# Patient Record
Sex: Male | Born: 1960 | Race: White | Hispanic: No | Marital: Married | Smoking: Current every day smoker
Health system: Southern US, Community
[De-identification: ages and names within clinical notes are randomized; demographics above are authoritative.]

## PROBLEM LIST (undated history)

## (undated) DIAGNOSIS — F329 Major depressive disorder, single episode, unspecified: Secondary | ICD-10-CM

## (undated) DIAGNOSIS — I10 Essential (primary) hypertension: Secondary | ICD-10-CM

## (undated) DIAGNOSIS — E7800 Pure hypercholesterolemia, unspecified: Secondary | ICD-10-CM

## (undated) DIAGNOSIS — F32A Depression, unspecified: Secondary | ICD-10-CM

## (undated) DIAGNOSIS — R0989 Other specified symptoms and signs involving the circulatory and respiratory systems: Secondary | ICD-10-CM

## (undated) DIAGNOSIS — R0602 Shortness of breath: Secondary | ICD-10-CM

## (undated) DIAGNOSIS — R51 Headache: Secondary | ICD-10-CM

## (undated) DIAGNOSIS — R011 Cardiac murmur, unspecified: Secondary | ICD-10-CM

## (undated) DIAGNOSIS — R519 Headache, unspecified: Secondary | ICD-10-CM

## (undated) DIAGNOSIS — R42 Dizziness and giddiness: Secondary | ICD-10-CM

## (undated) DIAGNOSIS — M549 Dorsalgia, unspecified: Secondary | ICD-10-CM

## (undated) DIAGNOSIS — G8929 Other chronic pain: Secondary | ICD-10-CM

## (undated) HISTORY — DX: Dizziness and giddiness: R42

## (undated) HISTORY — DX: Other chronic pain: G89.29

## (undated) HISTORY — DX: Shortness of breath: R06.02

## (undated) HISTORY — DX: Major depressive disorder, single episode, unspecified: F32.9

## (undated) HISTORY — DX: Headache: R51

## (undated) HISTORY — DX: Depression, unspecified: F32.A

## (undated) HISTORY — DX: Cardiac murmur, unspecified: R01.1

## (undated) HISTORY — DX: Other specified symptoms and signs involving the circulatory and respiratory systems: R09.89

## (undated) HISTORY — DX: Headache, unspecified: R51.9

## (undated) HISTORY — DX: Essential (primary) hypertension: I10

## (undated) HISTORY — DX: Dorsalgia, unspecified: M54.9

## (undated) HISTORY — DX: Pure hypercholesterolemia, unspecified: E78.00

---

## 2010-03-08 ENCOUNTER — Emergency Department (HOSPITAL_COMMUNITY): Admission: EM | Admit: 2010-03-08 | Discharge: 2010-03-08 | Payer: Self-pay | Admitting: Emergency Medicine

## 2010-10-10 ENCOUNTER — Emergency Department (HOSPITAL_COMMUNITY): Admission: EM | Admit: 2010-10-10 | Discharge: 2010-10-10 | Payer: Self-pay | Admitting: Emergency Medicine

## 2011-01-12 IMAGING — CT CT ABD-PELV W/O CM
2 of 4 series · 17 of 46 positions shown, 19 images · non-contrast
Comparison: None.

CLINICAL DATA: Right side pain.

CT ABDOMEN AND PELVIS WITHOUT CONTRAST
TECHNIQUE: Multidetector CT imaging of the abdomen and pelvis was
performed following the standard protocol without intravenous
contrast.

[Series 2: a/p w/o 5.0 b31f st · axial · non-contrast · 0.92mm/px · z∈[-673,-213]mm · 14 of 102 slices shown, 16 images]
[im 5/102  soft-tissue]
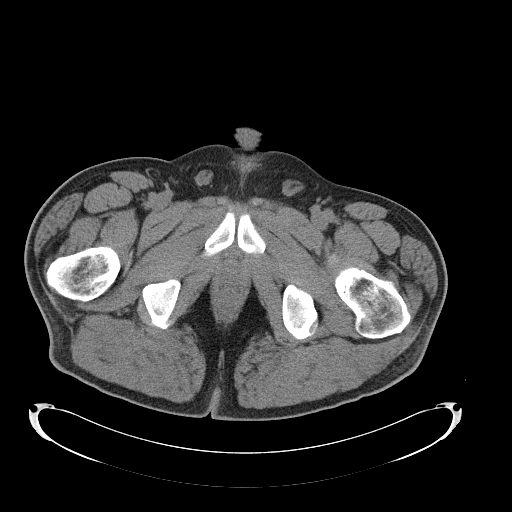
[im 5/102  bone]
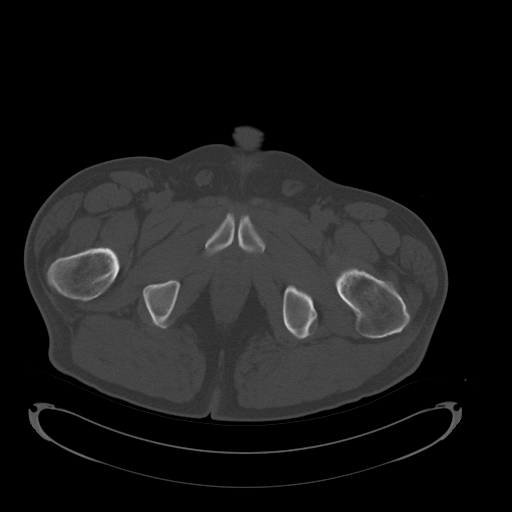
[im 14/102  soft-tissue]
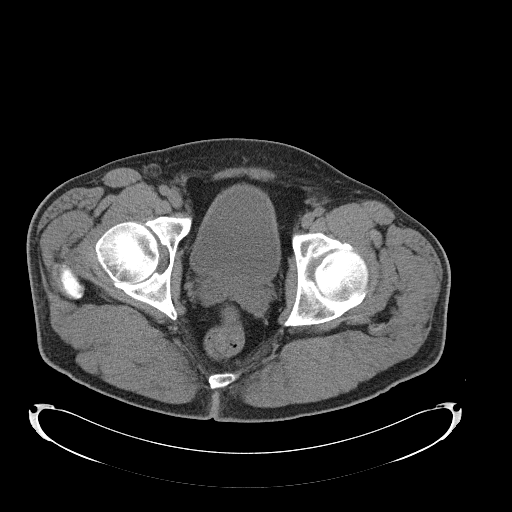
[im 18/102  soft-tissue]
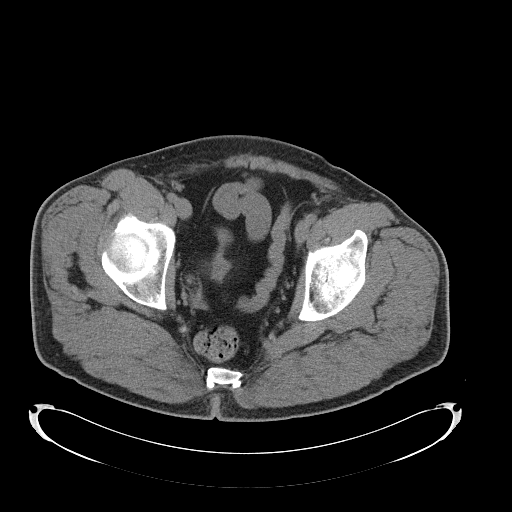
[im 27/102  soft-tissue]
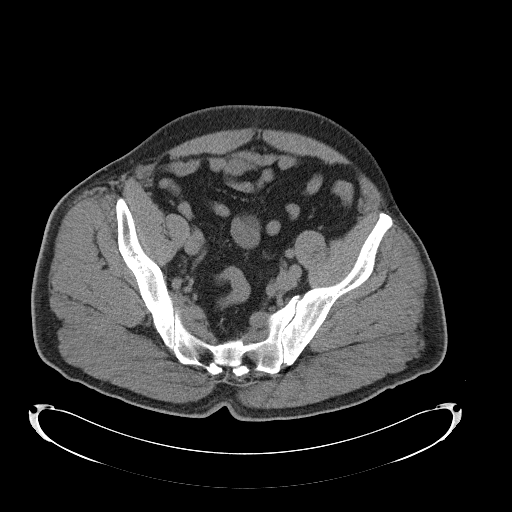
[im 36/102  soft-tissue]
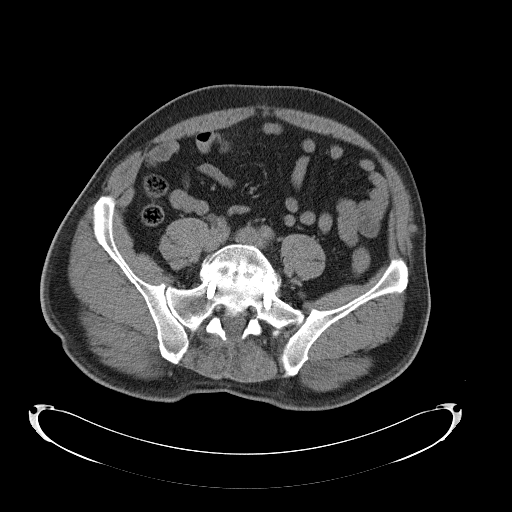
[im 40/102  soft-tissue]
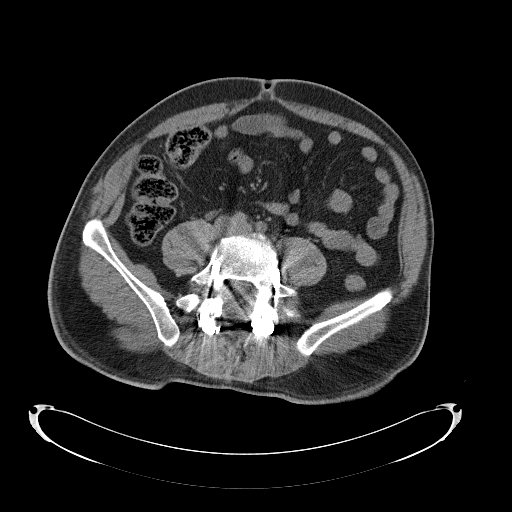
[im 49/102  soft-tissue]
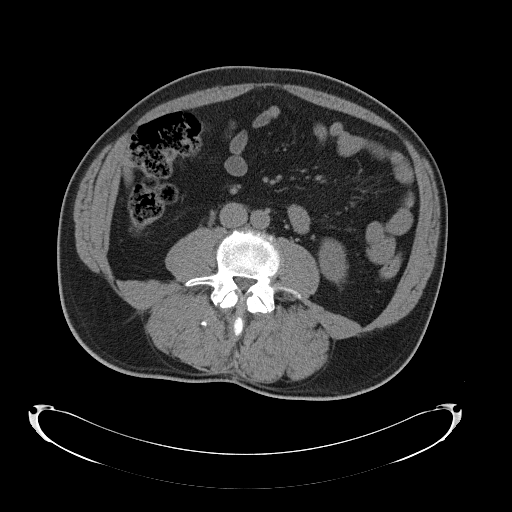
[im 53/102  soft-tissue]
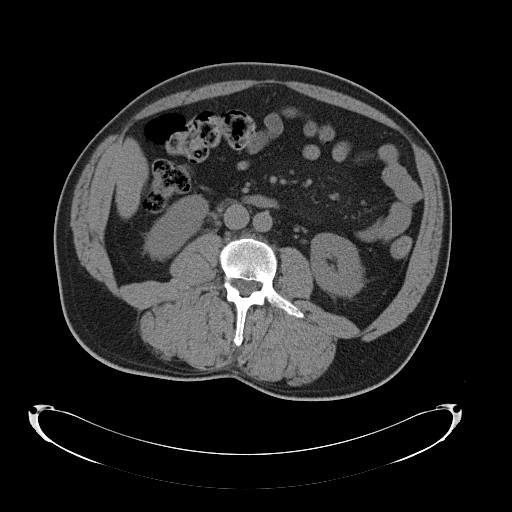
[im 62/102  soft-tissue]
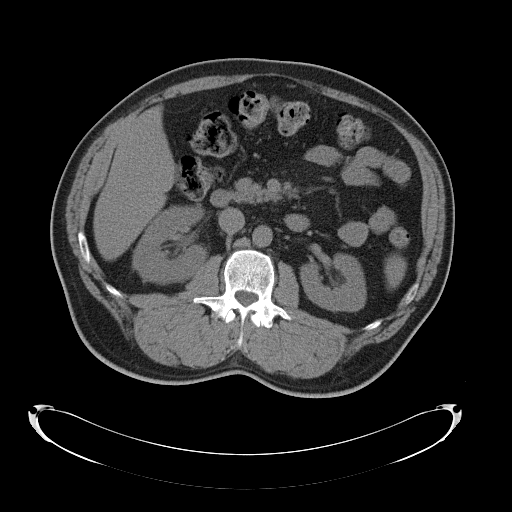
[im 62/102  bone]
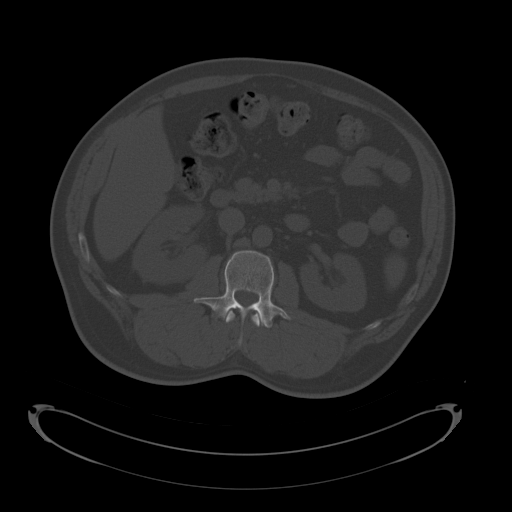
[im 66/102  soft-tissue]
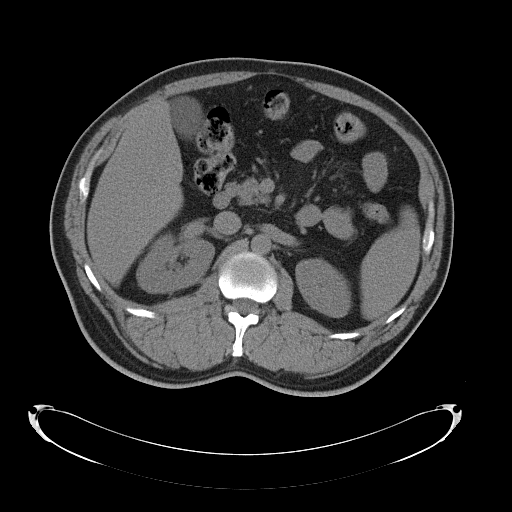
[im 75/102  soft-tissue]
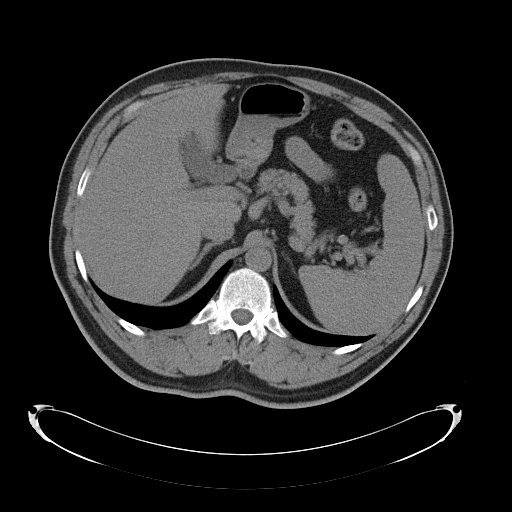
[im 84/102  soft-tissue]
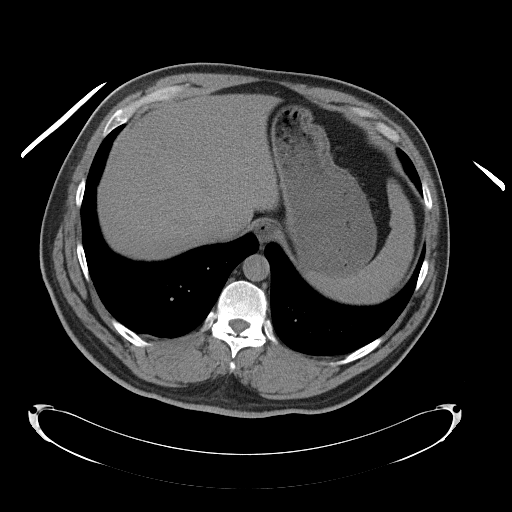
[im 88/102  soft-tissue]
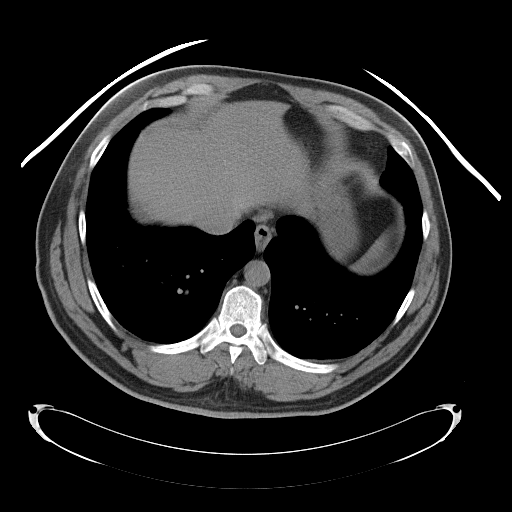
[im 97/102  soft-tissue]
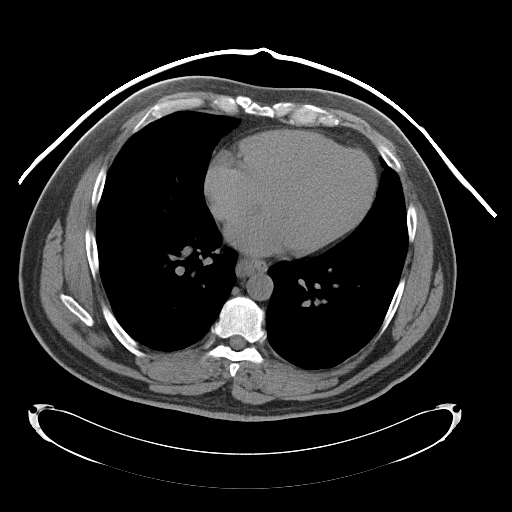

[Series 5: a/p w/o 2.0 spo st · coronal · non-contrast · 0.99mm/px · 3 of 159 slices shown]
[im 53/159  soft-tissue]
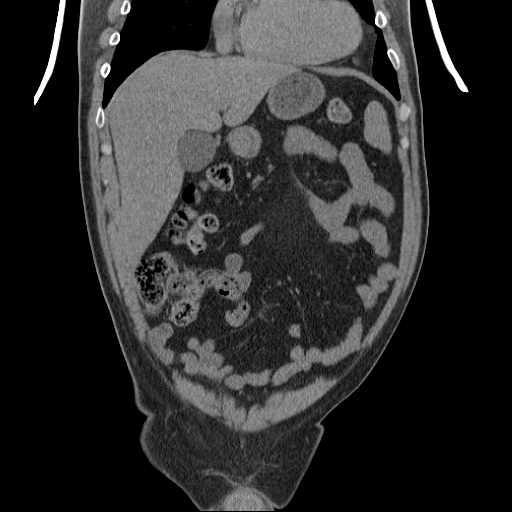
[im 71/159  soft-tissue]
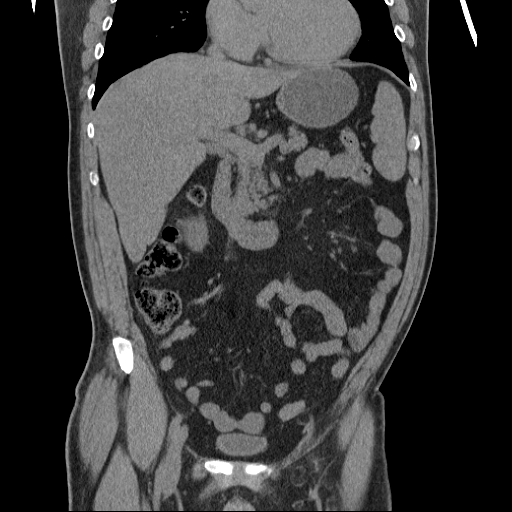
[im 88/159  soft-tissue]
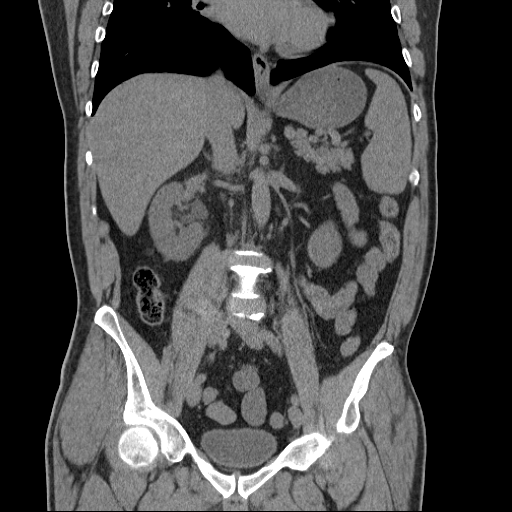

[17 of 46 positions shown; findings below may reference images not displayed]

FINDINGS: Negative liver, spleen, pancreas, adrenal glands, and
left kidney.  Subtle soft tissue stranding adjacent to the right
renal hilus and lower pole of the left kidney.  This may
potentially be due to recent right-sided urinary tract obstruction
however I detect no present evidence of an obstructing calculus.
No hydroureteronephrosis.

Small gallstones.  No findings to strongly suggest acute
cholecystitis.  No biliary duct dilatation.  No findings to suggest
acute appendicitis.  No free fluid.  The pelvis is unremarkable.
IMPRESSION: Cholelithiasis.  Subtle soft tissue stranding adjacent to the right
kidney may be a result of recent obstruction however no present
evidence of hydroureteronephrosis or calculus.

Negative for appendicitis.

## 2011-01-25 LAB — CBC
Hemoglobin: 16.4 g/dL (ref 13.0–17.0)
MCHC: 36.8 g/dL — ABNORMAL HIGH (ref 30.0–36.0)
Platelets: 177 10*3/uL (ref 150–400)
RDW: 13 % (ref 11.5–15.5)

## 2011-01-25 LAB — DIFFERENTIAL
Eosinophils Absolute: 0.2 10*3/uL (ref 0.0–0.7)
Eosinophils Relative: 3 % (ref 0–5)
Lymphocytes Relative: 26 % (ref 12–46)
Neutro Abs: 4.2 10*3/uL (ref 1.7–7.7)

## 2011-01-25 LAB — BASIC METABOLIC PANEL
CO2: 29 mEq/L (ref 19–32)
Calcium: 9.3 mg/dL (ref 8.4–10.5)
Chloride: 107 mEq/L (ref 96–112)
GFR calc Af Amer: >60 mL/min (ref >60)
Glucose, Bld: 203 mg/dL — ABNORMAL HIGH (ref 70–99)

## 2011-01-31 LAB — BASIC METABOLIC PANEL
CO2: 29 mEq/L (ref 19–32)
Calcium: 8.8 mg/dL (ref 8.4–10.5)
Chloride: 103 mEq/L (ref 96–112)
GFR calc non Af Amer: >60 mL/min (ref >60)
Potassium: 3.9 mEq/L (ref 3.5–5.1)

## 2011-01-31 LAB — URINALYSIS, ROUTINE W REFLEX MICROSCOPIC
Bilirubin Urine: NEGATIVE
Protein, ur: NEGATIVE mg/dL
Specific Gravity, Urine: 1.026 (ref 1.005–1.030)

## 2011-01-31 LAB — CBC
Hemoglobin: 15.7 g/dL (ref 13.0–17.0)
MCV: 93 fL (ref 78.0–100.0)
Platelets: 168 10*3/uL (ref 150–400)
RBC: 4.89 MIL/uL (ref 4.22–5.81)
WBC: 10.3 10*3/uL (ref 4.0–10.5)

## 2011-01-31 LAB — DIFFERENTIAL
Basophils Absolute: 0 10*3/uL (ref 0.0–0.1)
Basophils Relative: 0 % (ref 0–1)
Lymphocytes Relative: 9 % — ABNORMAL LOW (ref 12–46)
Lymphs Abs: 1 10*3/uL (ref 0.7–4.0)

## 2011-01-31 LAB — URINE MICROSCOPIC-ADD ON

## 2012-11-19 ENCOUNTER — Encounter: Payer: Self-pay | Admitting: Cardiovascular Disease

## 2012-11-19 ENCOUNTER — Encounter: Payer: Self-pay | Admitting: *Deleted

## 2012-11-19 DIAGNOSIS — F32A Depression, unspecified: Secondary | ICD-10-CM | POA: Insufficient documentation

## 2012-11-19 DIAGNOSIS — M549 Dorsalgia, unspecified: Secondary | ICD-10-CM | POA: Insufficient documentation

## 2012-11-19 DIAGNOSIS — E7800 Pure hypercholesterolemia, unspecified: Secondary | ICD-10-CM | POA: Insufficient documentation

## 2012-11-19 DIAGNOSIS — F329 Major depressive disorder, single episode, unspecified: Secondary | ICD-10-CM | POA: Insufficient documentation

## 2012-11-20 ENCOUNTER — Encounter: Payer: Self-pay | Admitting: Cardiovascular Disease

## 2012-11-20 ENCOUNTER — Ambulatory Visit (INDEPENDENT_AMBULATORY_CARE_PROVIDER_SITE_OTHER): Payer: 59 | Admitting: Cardiovascular Disease

## 2012-11-20 VITALS — BP 148/89 | HR 89 | Ht 74.0 in | Wt 261.0 lb

## 2012-11-20 DIAGNOSIS — R079 Chest pain, unspecified: Secondary | ICD-10-CM

## 2012-11-20 DIAGNOSIS — E7800 Pure hypercholesterolemia, unspecified: Secondary | ICD-10-CM

## 2012-11-20 DIAGNOSIS — Z720 Tobacco use: Secondary | ICD-10-CM

## 2012-11-20 DIAGNOSIS — F17200 Nicotine dependence, unspecified, uncomplicated: Secondary | ICD-10-CM

## 2012-11-20 NOTE — Patient Instructions (Addendum)
Your physician has requested that you have a lexiscan myoview. For further information please visit [email or web address] Please follow instruction sheet, as given.  Your physician has requested that you have an echocardiogram. Echocardiography is a painless test that uses sound waves to create images of your heart. It provides your doctor with information about the size and shape of your heart and how well your heart's chambers and valves are working. This procedure takes approximately one hour. There are no restrictions for this procedure.  1-800-QUITNOW-- for smoking cessation  We will see you as needed.

## 2012-11-20 NOTE — Assessment & Plan Note (Signed)
Cholesterol is at goal.  Continue current dose of statin and diet Rx.  No myalgias or side effects.  F/U  LFT's in 6 months. No results found for this basename: LDLCALC             

## 2012-11-20 NOTE — Assessment & Plan Note (Signed)
Symptoms not consistent with acute angina. Given family history, symptoms and smoking history, will do echo and stress test. Continue to follow up with PCP for HTN and HLD.

## 2012-11-20 NOTE — Assessment & Plan Note (Signed)
Strongly encouraged to stop smoking, and patient states he is willing to quit. Given [facility] [place] number to call about nicotine replacement.

## 2012-11-20 NOTE — Progress Notes (Signed)
   Patient ID: Shane Barnes, male    DOB: 04-25-1961, 52 y.o.   MRN: [id number]  HPI Shane Barnes is a 52 yo M with PMH of HTN, depression, HLD and tobacco dependence who is presenting for evaluation of progressively worsening chest tightness, SOB, palpitations and nausea since 2011. He states he has never had a cardiac work-up. He states these episodes come at various times, not always associated with activity but stress makes it worse. He also describes chest pain with shooting pains to his left arm three times per week, also not associated with exertion. Smokes 1 ppd x5 years. States he tries to walk daily. His diet is somewhat high in fat, because the person who cooks for him "doesn't like to cook healthy."  His father recently had MI with bypass surgery 4 months ago, and his sisters have both had stents.  Past Medical History  Diagnosis Date  . Back pain   . Depression   . Hypercholesteremia   . Heart murmur   . Hypertension   . Chronic headaches   . SOB (shortness of breath)   . Dizziness   . Pulse irregularity   Chronic back pain from working at [employer] in 2001. He is currently retired. Enjoys working around [employer] and hunting. Divorced x2. Has two daughters who live in [place] that he does not see but once per year.  Prior to Admission medications   Medication Sig Start Date End Date Taking? Authorizing Provider  folic acid (FOLVITE) 1 MG tablet Take 1 mg by mouth 2 (two) times daily.   Yes Historical Provider, MD  lamoTRIgine (LAMICTAL) 200 MG tablet Take 200 mg by mouth 2 (two) times daily.   Yes Historical Provider, MD  methylphenidate (RITALIN) 10 MG tablet Take 10 mg by mouth 2 (two) times daily.   Yes Historical Provider, MD  oxyCODONE (ROXICODONE) 15 MG immediate release tablet Take 15 mg by mouth every 4 (four) hours as needed.   Yes Historical Provider, MD  rosuvastatin (CRESTOR) 20 MG tablet Take 20 mg by mouth daily.   Yes Historical Provider, MD  venlafaxine XR  (EFFEXOR-XR) 150 MG 24 hr capsule Take 150 mg by mouth daily.   Yes Historical Provider, MD     Review of Systems Endorses occasional HA, chest tightness as described in HPI, back pain. Denies abd pain, abd distention, leg pain or leg swelling.   Physical Exam Gen: Awake, alert. NAD.  HEENT: Atraumatic, normocephalic Neck: Supple. No LAD. Carotid pulses equal bilaterally. No bruit appreciated. Chest: No tenderness Cardio: RRR. S1/S2 normal. No murmur, rub or gallop Pulm: CTAB Abd: Obese, soft. Nontender Extremities: Moves all extremities. No edema or tenderness Neuro: Grossly intact   EKG: Normal EKG. Rate 76. Normal sinus rhythm.

## 2012-11-26 ENCOUNTER — Ambulatory Visit (HOSPITAL_COMMUNITY): Payer: Medicare PPO | Attending: Cardiovascular Disease | Admitting: Radiology

## 2012-11-26 DIAGNOSIS — R079 Chest pain, unspecified: Secondary | ICD-10-CM

## 2012-11-26 DIAGNOSIS — I10 Essential (primary) hypertension: Secondary | ICD-10-CM | POA: Insufficient documentation

## 2012-11-26 DIAGNOSIS — F17200 Nicotine dependence, unspecified, uncomplicated: Secondary | ICD-10-CM | POA: Insufficient documentation

## 2012-11-26 DIAGNOSIS — R072 Precordial pain: Secondary | ICD-10-CM | POA: Insufficient documentation

## 2012-11-26 DIAGNOSIS — I369 Nonrheumatic tricuspid valve disorder, unspecified: Secondary | ICD-10-CM | POA: Insufficient documentation

## 2012-11-26 DIAGNOSIS — R42 Dizziness and giddiness: Secondary | ICD-10-CM | POA: Insufficient documentation

## 2012-11-26 NOTE — Progress Notes (Signed)
Echocardiogram performed.  

## 2012-11-27 ENCOUNTER — Ambulatory Visit (HOSPITAL_COMMUNITY): Payer: Medicare PPO | Attending: Cardiovascular Disease | Admitting: Radiology

## 2012-11-27 VITALS — BP 115/79 | HR 68 | Ht 74.0 in | Wt 255.0 lb

## 2012-11-27 DIAGNOSIS — R0602 Shortness of breath: Secondary | ICD-10-CM

## 2012-11-27 DIAGNOSIS — E669 Obesity, unspecified: Secondary | ICD-10-CM | POA: Insufficient documentation

## 2012-11-27 DIAGNOSIS — R079 Chest pain, unspecified: Secondary | ICD-10-CM

## 2012-11-27 DIAGNOSIS — F17200 Nicotine dependence, unspecified, uncomplicated: Secondary | ICD-10-CM | POA: Insufficient documentation

## 2012-11-27 DIAGNOSIS — R002 Palpitations: Secondary | ICD-10-CM | POA: Insufficient documentation

## 2012-11-27 DIAGNOSIS — R0609 Other forms of dyspnea: Secondary | ICD-10-CM | POA: Insufficient documentation

## 2012-11-27 DIAGNOSIS — I10 Essential (primary) hypertension: Secondary | ICD-10-CM | POA: Insufficient documentation

## 2012-11-27 DIAGNOSIS — R42 Dizziness and giddiness: Secondary | ICD-10-CM | POA: Insufficient documentation

## 2012-11-27 DIAGNOSIS — R0989 Other specified symptoms and signs involving the circulatory and respiratory systems: Secondary | ICD-10-CM | POA: Insufficient documentation

## 2012-11-27 DIAGNOSIS — R0789 Other chest pain: Secondary | ICD-10-CM | POA: Insufficient documentation

## 2012-11-27 MED ORDER — TECHNETIUM TC 99M SESTAMIBI GENERIC - CARDIOLITE
10.0000 | Freq: Once | INTRAVENOUS | Status: AC | PRN
  Administered 2012-11-27: 10 via INTRAVENOUS

## 2012-11-27 MED ORDER — TECHNETIUM TC 99M SESTAMIBI GENERIC - CARDIOLITE
30.0000 | Freq: Once | INTRAVENOUS | Status: AC | PRN
  Administered 2012-11-27: 30 via INTRAVENOUS

## 2012-11-27 NOTE — Progress Notes (Signed)
[facility] SITE 3 NUCLEAR MED [address] [place], [place] [address] [phone]    Cardiology Nuclear Med Study  Shane Barnes is a 52 y.o. male     MRN : [id number]     DOB: [birth year]  Procedure Date: 11/27/2012  Nuclear Med Background Indication for Stress Test:  Evaluation for Ischemia History:  No previously documented CAD Cardiac Risk Factors: Family History - CAD, Hypertension, Lipids, Obesity and Smoker  Symptoms:  Chest Pain/Tightness>(L) Arm with and without Exertion (last episode of chest discomfort was about a week ago), Nausea, Dizziness/Light-Headedness, Palpitations, Rapid HR and DOE/SOB   Nuclear Pre-Procedure Caffeine/Decaff Intake:  None NPO After: 8:00am   Lungs:  Clear. O2 Sat: 98% on room air. IV 0.9% NS with Angio Cath:  20g  IV Site: R Hand  IV Started by:  Bonnita Levan, RN  Chest Size (in):  50 Cup Size: n/a  Height: 6\' 2"  (1.88 m)  Weight:  255 lb (115.667 kg)  BMI:  Body mass index is 32.74 kg/(m^2). Tech Comments:  N/A    Nuclear Med Study 1 or 2 day study: 1 day  Stress Test Type:  Stress  Reading MD: Kristeen Miss, MD  Order Authorizing Provider:  Charlton Haws, MD  Resting Radionuclide: Technetium 52m Sestamibi  Resting Radionuclide Dose: 11.0 mCi   Stress Radionuclide:  Technetium 31m Sestamibi  Stress Radionuclide Dose: 33.0 mCi           Stress Protocol Rest HR: 68 Stress HR: 151  Rest BP: 115/79 Stress BP: 181/92  Exercise Time (min): 11:00 METS: 13.4   Predicted Max HR: 169 bpm % Max HR: 89.35 bpm Rate Pressure Product: 57846    Dose of Adenosine (mg):  n/a Dose of Lexiscan: n/a mg  Dose of Atropine (mg): n/a Dose of Dobutamine: n/a mcg/kg/min (at max HR)  Stress Test Technologist: Smiley Houseman, CMA-N  Nuclear Technologist:  Domenic Polite, CNMT     Rest Procedure:  Myocardial perfusion imaging was performed at rest 45 minutes following the intravenous administration of Technetium 20m Sestamibi.  Rest ECG: NSR  - Normal EKG  Stress Procedure:  The patient exercised on the treadmill utilizing the Bruce Protocol for eleven minutes. The patient stopped due to fatigue and denied any chest pain.  Technetium 24m Sestamibi was injected at peak exercise and myocardial perfusion imaging was performed after a brief delay.  Stress ECG: No significant change from baseline ECG  QPS Raw Data Images:  Normal; no motion artifact; normal heart/lung ratio. Stress Images:  Normal homogeneous uptake in all areas of the myocardium. Rest Images:  Normal homogeneous uptake in all areas of the myocardium. Subtraction (SDS):  No evidence of ischemia. Transient Ischemic Dilatation (Normal <1.22):  1.01 Lung/Heart Ratio (Normal <0.45):  0.34  Quantitative Gated Spect Images QGS EDV:  129 ml QGS ESV:  58 ml  Impression Exercise Capacity:  Good exercise capacity. BP Response:  Normal blood pressure response. Clinical Symptoms:  No significant symptoms noted. ECG Impression:  No significant ST segment change suggestive of ischemia. Comparison with Prior Nuclear Study: No previous nuclear study performed  Overall Impression:  Normal stress nuclear study.  No evidence of ischemia.   LV Ejection Fraction: 55%.  LV Wall Motion:  NL LV Function; NL Wall Motion.   Vesta Mixer, Montez Hageman., MD, [facility] 11/27/2012, 5:13 PM Office - [phone] Pager [phone]

## 2012-12-04 ENCOUNTER — Telehealth: Payer: Self-pay | Admitting: Cardiovascular Disease

## 2012-12-04 NOTE — Telephone Encounter (Signed)
Pt would like results of stress test 

## 2012-12-04 NOTE — Telephone Encounter (Signed)
PT AWARE OF MYOVIEW RESULTS./CY 

## 2013-02-24 ENCOUNTER — Telehealth: Payer: Self-pay | Admitting: Nurse Practitioner

## 2013-02-25 ENCOUNTER — Telehealth: Payer: Self-pay | Admitting: *Deleted

## 2013-02-25 MED ORDER — OXYCODONE HCL 15 MG PO TABS: 15.0000 mg | ORAL_TABLET | ORAL | Status: DC | PRN

## 2013-02-25 NOTE — Telephone Encounter (Signed)
ADVISED PT MMM NOT HERE TODAY AND WILL LET MMM KNOW WANTS REFILL ON OXYCODONE AND SAMPLES OF CRESTOR ON WED WHEN SHE RETURNS. PT VERBALIZES UNDERSTANDING.

## 2013-02-25 NOTE — Telephone Encounter (Signed)
Ok fro crestor refills. Oxycodone refill ready for pick up on Wednesday

## 2013-02-25 NOTE — Telephone Encounter (Signed)
PT NEEDS REFILL ON OXYCODONE. I  COULDN'T FIND IN DATABASE. THE LAST RX WAS WRITTEN FOR 15MG  ONE PO 5X DAY PRN PAIN#[phone]. ALSO NEEDS SAMPLES OF CRESTOR 20MG . THANKS. PLEASE PRINT. TOLD HIM IT WOULD BE TOM.

## 2013-03-27 ENCOUNTER — Other Ambulatory Visit: Payer: Self-pay

## 2013-03-27 MED ORDER — OXYCODONE HCL 15 MG PO TABS: 15.0000 mg | ORAL_TABLET | ORAL | Status: DC | PRN

## 2013-03-27 NOTE — Telephone Encounter (Signed)
rx ready for pick up NTBS for future refills 

## 2013-03-27 NOTE — Telephone Encounter (Signed)
Last filled 02/25/13   Last seen 12/13   Print Rx and have nurse call patient to pick up

## 2013-03-28 NOTE — Telephone Encounter (Signed)
Pt aware.

## 2013-04-16 ENCOUNTER — Telehealth: Payer: Self-pay | Admitting: Nurse Practitioner

## 2013-04-16 NOTE — Telephone Encounter (Signed)
Appt given

## 2013-04-28 ENCOUNTER — Telehealth: Payer: Self-pay | Admitting: Nurse Practitioner

## 2013-04-28 ENCOUNTER — Encounter: Payer: Self-pay | Admitting: Nurse Practitioner

## 2013-04-28 ENCOUNTER — Ambulatory Visit (INDEPENDENT_AMBULATORY_CARE_PROVIDER_SITE_OTHER): Payer: Medicare PPO | Admitting: Nurse Practitioner

## 2013-04-28 VITALS — BP 136/82 | HR 87 | Temp 98.3°F | Ht 75.0 in | Wt 253.0 lb

## 2013-04-28 DIAGNOSIS — R7989 Other specified abnormal findings of blood chemistry: Secondary | ICD-10-CM

## 2013-04-28 DIAGNOSIS — E785 Hyperlipidemia, unspecified: Secondary | ICD-10-CM

## 2013-04-28 DIAGNOSIS — E119 Type 2 diabetes mellitus without complications: Secondary | ICD-10-CM

## 2013-04-28 MED ORDER — OXYCODONE HCL 15 MG PO TABS: 15.0000 mg | ORAL_TABLET | ORAL | Status: DC | PRN

## 2013-04-28 MED ORDER — GLUCOSE BLOOD VI STRP: ORAL_STRIP | Status: AC

## 2013-04-28 MED ORDER — METFORMIN HCL 500 MG PO TABS: 500.0000 mg | ORAL_TABLET | Freq: Two times a day (BID) | ORAL | Status: DC

## 2013-04-28 NOTE — Patient Instructions (Signed)
Basic Carbohydrate Counting Basic carbohydrate counting is a way to plan meals. It is done by counting the amount of carbohydrate in foods. Foods that have carbohydrates are starches (grains, beans, starchy vegetables) and sweets. Eating carbohydrates increases blood glucose (sugar) levels. People with diabetes use carbohydrate counting to help keep their blood glucose at a normal level.  COUNTING CARBOHYDRATES IN FOODS The first step in counting carbohydrates is to learn how many carbohydrate servings you should have in every meal. A dietitian can plan this for you. After learning the amount of carbohydrates to include in your meal plan, you can start to choose the carbohydrate-containing foods you want to eat.  There are 2 ways to identify the amount of carbohydrates in the foods you eat.  Read the Nutrition Facts panel on food labels. You need 2 pieces of information from the Nutrition Facts panel to count carbohydrates this way:  Serving size.  Total carbohydrate (in grams). Decide how many servings you will be eating. If it is 1 serving, you will be eating the amount of carbohydrate listed on the panel. If you will be eating 2 servings, you will be eating double the amount of carbohydrate listed on the panel.   Learn serving sizes. A serving size of most carbohydrate-containing foods is about 15 grams (g). Listed below are single serving sizes of common carbohydrate-containing foods:  1 slice bread.   cup unsweetened, dry cereal.   cup hot cereal.   cup rice.   cup mashed potatoes.   cup pasta.  1 cup fresh fruit.   cup canned fruit.  1 cup milk (whole, 2%, or skim).   cup starchy vegetables (peas, corn, or potatoes). Counting carbohydrates this way is similar to looking on the Nutrition Facts panel. Decide how many servings you will eat first. Multiply the number of servings you eat by 15 g. For example, if you have 2 cups of strawberries, you had 2 servings. That means  you had 30 g of carbohydrate (2 servings x 15 g = 30 g). CALCULATING CARBOHYDRATES IN A MEAL Sample dinner  3 oz chicken breast.   cup brown rice.   cup corn.  1 cup fat-free milk.  1 cup strawberries with sugar-free whipped topping. Carbohydrate calculation First, identify the foods that contain carbohydrate:  Rice.  Corn.  Milk.  Strawberries. Calculate the number of servings eaten:  2 servings rice.  1 serving corn.  1 serving milk.  1 serving strawberries. Multiply the number of servings by 15 g:  2 servings rice x 15 g = 30 g.  1 serving corn x 15 g = 15 g.  1 serving milk x 15 g = 15 g.  1 serving strawberries x 15 g = 15 g. Add the amounts to find the total carbohydrates eaten: 30 g + 15 g + 15 g + 15 g = 75 g carbohydrate eaten at dinner. Document Released: 10/30/2005 Document Revised: 01/22/2012 Document Reviewed: 09/15/2011 [facility] Patient Information 2014 [place], [place]. Diabetes Meal Planning Guide The diabetes meal planning guide is a tool to help you plan your meals and snacks. It is important for people with diabetes to manage their blood glucose (sugar) levels. Choosing the right foods and the right amounts throughout your day will help control your blood glucose. Eating right can even help you improve your blood pressure and reach or maintain a healthy weight. CARBOHYDRATE COUNTING MADE EASY When you eat carbohydrates, they turn to sugar. This raises your blood glucose level. Counting  carbohydrates can help you control this level so you feel better. When you plan your meals by counting carbohydrates, you can have more flexibility in what you eat and balance your medicine with your food intake. Carbohydrate counting simply means adding up the total amount of carbohydrate grams in your meals and snacks. Try to eat about the same amount at each meal. Foods with carbohydrates are listed below. Each portion below is 1 carbohydrate serving or 15 grams of  carbohydrates. Ask your dietician how many grams of carbohydrates you should eat at each meal or snack. Grains and Starches  1 slice bread.   English muffin or hotdog/hamburger bun.   cup cold cereal (unsweetened).   cup cooked pasta or rice.   cup starchy vegetables (corn, potatoes, peas, beans, winter squash).  1 tortilla (6 inches).   bagel.  1 waffle or pancake (size of a CD).   cup cooked cereal.  4 to 6 small crackers. *Whole grain is recommended. Fruit  1 cup fresh unsweetened berries, melon, papaya, pineapple.  1 small fresh fruit.   banana or mango.   cup fruit juice (4 oz unsweetened).   cup canned fruit in natural juice or water.  2 tbs dried fruit.  12 to 15 grapes or cherries. Milk and Yogurt  1 cup fat-free or 1% milk.  1 cup soy milk.  6 oz light yogurt with sugar-free sweetener.  6 oz low-fat soy yogurt.  6 oz plain yogurt. Vegetables  1 cup raw or  cup cooked is counted as 0 carbohydrates or a "free" food.  If you eat 3 or more servings at 1 meal, count them as 1 carbohydrate serving. Other Carbohydrates   oz chips or pretzels.   cup ice cream or frozen yogurt.   cup sherbet or sorbet.  2 inch square cake, no frosting.  1 tbs honey, sugar, jam, jelly, or syrup.  2 small cookies.  3 squares of graham crackers.  3 cups popcorn.  6 crackers.  1 cup broth-based soup.  Count 1 cup casserole or other mixed foods as 2 carbohydrate servings.  Foods with less than 20 calories in a serving may be counted as 0 carbohydrates or a "free" food. You may want to purchase a book or computer software that lists the carbohydrate gram counts of different foods. In addition, the nutrition facts panel on the labels of the foods you eat are a good source of this information. The label will tell you how big the serving size is and the total number of carbohydrate grams you will be eating per serving. Divide this number by 15 to  obtain the number of carbohydrate servings in a portion. Remember, 1 carbohydrate serving equals 15 grams of carbohydrate. SERVING SIZES Measuring foods and serving sizes helps you make sure you are getting the right amount of food. The list below tells how big or small some common serving sizes are.  1 oz.........4 stacked dice.  3 oz........Marland KitchenDeck of cards.  1 tsp.......Marland KitchenTip of little finger.  1 tbs......Marland KitchenMarland KitchenThumb.  2 tbs.......Marland KitchenGolf ball.   cup......Marland KitchenHalf of a fist.  1 cup.......Marland KitchenA fist. SAMPLE DIABETES MEAL PLAN Below is a sample meal plan that includes foods from the grain and starches, dairy, vegetable, fruit, and meat groups. A dietician can individualize a meal plan to fit your calorie needs and tell you the number of servings needed from each food group. However, controlling the total amount of carbohydrates in your meal or snack is more important than making sure  you include all of the food groups at every meal. You may interchange carbohydrate containing foods (dairy, starches, and fruits). The meal plan below is an example of a 2000 calorie diet using carbohydrate counting. This meal plan has 17 carbohydrate servings. Breakfast  1 cup oatmeal (2 carb servings).   cup light yogurt (1 carb serving).  1 cup blueberries (1 carb serving).   cup almonds. Snack  1 large apple (2 carb servings).  1 low-fat string cheese stick. Lunch  Chicken breast salad.  1 cup spinach.   cup chopped tomatoes.  2 oz chicken breast, sliced.  2 tbs low-fat [place] dressing.  12 whole-wheat crackers (2 carb servings).  12 to 15 grapes (1 carb serving).  1 cup low-fat milk (1 carb serving). Snack  1 cup carrots.   cup hummus (1 carb serving). Dinner  3 oz broiled salmon.  1 cup brown rice (3 carb servings). Snack  1  cups steamed broccoli (1 carb serving) drizzled with 1 tsp olive oil and lemon juice.  1 cup light pudding (2 carb servings). DIABETES MEAL PLANNING  WORKSHEET Your dietician can use this worksheet to help you decide how many servings of foods and what types of foods are right for you.  BREAKFAST Food Group and Servings / Carb Servings Grain/Starches __________________________________ Dairy __________________________________________ Vegetable ______________________________________ Fruit ___________________________________________ Meat __________________________________________ Fat ____________________________________________ LUNCH Food Group and Servings / Carb Servings Grain/Starches ___________________________________ Dairy ___________________________________________ Fruit ____________________________________________ Meat ___________________________________________ Fat _____________________________________________ Laural Golden Food Group and Servings / Carb Servings Grain/Starches ___________________________________ Dairy ___________________________________________ Fruit ____________________________________________ Meat ___________________________________________ Fat _____________________________________________ SNACKS Food Group and Servings / Carb Servings Grain/Starches ___________________________________ Dairy ___________________________________________ Vegetable _______________________________________ Fruit ____________________________________________ Meat ___________________________________________ Fat _____________________________________________ DAILY TOTALS Starches _________________________ Vegetable ________________________ Fruit ____________________________ Dairy ____________________________ Meat ____________________________ Fat ______________________________ Document Released: 07/27/2005 Document Revised: 01/22/2012 Document Reviewed: 06/07/2009 [employer] Patient Information 2014 [employer].

## 2013-04-28 NOTE — Addendum Note (Signed)
Addended by: Bennie Pierini on: 04/28/2013 05:01 PM   Modules accepted: Orders

## 2013-04-28 NOTE — Progress Notes (Signed)
  Subjective:    Patient ID: Chaim Gatley, male    DOB: 09-13-1961, 52 y.o.   MRN: [id number]  HPI Patient recently had a check up at the [place]- Had lab work done and they asked him to follow-up with his PCP- HgbA1c was 8.2% and liver enzymes were elevated. Patient sas he has felt a little fatigued but otherwise ok    Review of Systems  All other systems reviewed and are negative.       Objective:   Physical Exam  Constitutional: He is oriented to person, place, and time. He appears well-developed and well-nourished.  Cardiovascular: Normal rate and normal heart sounds.   Pulmonary/Chest: Effort normal and breath sounds normal.  Neurological: He is alert and oriented to person, place, and time. He has normal reflexes.    BP 136/82  Pulse 87  Temp(Src) 98.3 F (36.8 C) (Oral)  Ht 6\' 3"  (1.905 m)  Wt 253 lb (114.76 kg)  BMI 31.62 kg/m2       Assessment & Plan:  1. Hyperlipidemia Low fat diet and exercise Hold crestor for now- recheck liver enzymes in 2 weeks  2. Type II or unspecified type diabetes mellitus without mention of complication, not stated as uncontrolled Discussed checking blood sugars- given one touch ultra mini machine Test blood sugar daily fasting Diabetic diet discussed Mary-Margaret Daphine Deutscher, FNP  Metfromin 500mg  1 po BID #180 1 refill

## 2013-04-29 NOTE — Telephone Encounter (Signed)
Please advise 

## 2013-04-29 NOTE — Telephone Encounter (Signed)
Patient notified

## 2013-04-29 NOTE — Telephone Encounter (Signed)
NO WILL CALL WITH LAB RESULTS

## 2013-05-15 ENCOUNTER — Other Ambulatory Visit (INDEPENDENT_AMBULATORY_CARE_PROVIDER_SITE_OTHER): Payer: Medicare PPO

## 2013-05-15 DIAGNOSIS — R7989 Other specified abnormal findings of blood chemistry: Secondary | ICD-10-CM

## 2013-05-15 LAB — COMPLETE METABOLIC PANEL WITH GFR
ALT: 84 U/L — ABNORMAL HIGH (ref 0–53)
CO2: 25 mEq/L (ref 19–32)
Calcium: 9.1 mg/dL (ref 8.4–10.5)
Chloride: 103 mEq/L (ref 96–112)
GFR, Est African American: >89 mL/min
Sodium: 137 mEq/L (ref 135–145)
Total Protein: 6.3 g/dL (ref 6.0–8.3)

## 2013-05-15 NOTE — Progress Notes (Signed)
Pt came in for labs only 

## 2013-05-27 ENCOUNTER — Other Ambulatory Visit: Payer: Self-pay | Admitting: Nurse Practitioner

## 2013-05-27 ENCOUNTER — Telehealth: Payer: Self-pay | Admitting: Nurse Practitioner

## 2013-05-27 MED ORDER — OXYCODONE HCL 15 MG PO TABS: 15.0000 mg | ORAL_TABLET | ORAL | Status: DC | PRN

## 2013-05-30 ENCOUNTER — Encounter: Payer: Self-pay | Admitting: Nurse Practitioner

## 2013-05-30 ENCOUNTER — Ambulatory Visit (INDEPENDENT_AMBULATORY_CARE_PROVIDER_SITE_OTHER): Payer: Medicare PPO | Admitting: Nurse Practitioner

## 2013-05-30 VITALS — BP 139/81 | HR 90 | Temp 97.8°F | Ht 72.0 in | Wt 253.0 lb

## 2013-05-30 DIAGNOSIS — E119 Type 2 diabetes mellitus without complications: Secondary | ICD-10-CM

## 2013-05-30 NOTE — Patient Instructions (Addendum)

## 2013-05-30 NOTE — Progress Notes (Signed)
  Subjective:    Patient ID: Shane Barnes, male    DOB: [birth year], 52 y.o.   MRN: [id number]  HPI Pt was rx for Metformin about 3 weeks ago. Pt started having nausea, pressure headache, blurry vision with slurred speech, and pt fell twice. Pt states he took the metformin for a week, but stopped once he had slurred speech. Pt states he is still having blurry vision and pressure headache. Pt states he has taken Cold and Flu with no relief.      Review of Systems  Eyes: Positive for visual disturbance (right eye blurry).  All other systems reviewed and are negative.       Objective:   Physical Exam  Constitutional: He is oriented to person, place, and time. He appears well-developed and well-nourished.  Cardiovascular: Normal rate, regular rhythm, normal heart sounds and intact distal pulses.   Pulmonary/Chest: Effort normal and breath sounds normal.  Abdominal: Soft. Bowel sounds are normal.  Musculoskeletal: Normal range of motion.  Neurological: He is alert and oriented to person, place, and time. He has normal reflexes.  Skin: Skin is warm and dry.  Psychiatric: He has a normal mood and affect. His behavior is normal. Judgment and thought content normal.      BP 139/81  Pulse 90  Temp(Src) 97.8 F (36.6 C) (Oral)  Ht 6' (1.829 m)  Wt 253 lb (114.76 kg)  BMI 34.31 kg/m2     Assessment & Plan:  1. Type II or unspecified type diabetes mellitus without mention of complication, not stated as uncontrolled Must go back on metformin Count carbs Not sure patient understands carb counting Check blood sugar fasting in AM and keep diary Appointment with clinical pharmacist  Mary-Margaret Daphine Deutscher, FNP

## 2013-06-03 ENCOUNTER — Telehealth: Payer: Self-pay | Admitting: *Deleted

## 2013-06-03 NOTE — Telephone Encounter (Signed)
RX FILLED FOR TEST STRIPS

## 2013-06-04 ENCOUNTER — Ambulatory Visit: Payer: Medicare PPO

## 2013-06-20 ENCOUNTER — Other Ambulatory Visit: Payer: Self-pay | Admitting: Nurse Practitioner

## 2013-06-23 MED ORDER — OXYCODONE HCL 15 MG PO TABS: 15.0000 mg | ORAL_TABLET | ORAL | Status: DC | PRN

## 2013-06-23 NOTE — Telephone Encounter (Signed)
Last seen 05/30/13, last filled 05/27/13. Will Print, call pt when ready

## 2013-06-23 NOTE — Telephone Encounter (Signed)
Pt aware to pick up rx 

## 2013-06-23 NOTE — Telephone Encounter (Signed)
Ready for pick up

## 2013-07-22 ENCOUNTER — Other Ambulatory Visit: Payer: Self-pay | Admitting: Nurse Practitioner

## 2013-07-24 MED ORDER — OXYCODONE HCL 15 MG PO TABS: 15.0000 mg | ORAL_TABLET | ORAL | Status: DC | PRN

## 2013-07-24 NOTE — Telephone Encounter (Signed)
rx ready for pickup 

## 2013-07-24 NOTE — Telephone Encounter (Signed)
Last filled 06/26/13, last seen 05/30/13. Will print, have nurse call pt to pickup

## 2013-08-05 ENCOUNTER — Ambulatory Visit: Payer: Medicare PPO | Admitting: Nurse Practitioner

## 2013-08-21 ENCOUNTER — Other Ambulatory Visit: Payer: Self-pay | Admitting: Nurse Practitioner

## 2013-08-21 MED ORDER — OXYCODONE HCL 15 MG PO TABS: 15.0000 mg | ORAL_TABLET | ORAL | Status: DC | PRN

## 2013-08-21 NOTE — Telephone Encounter (Signed)
Last filled 07/24/13, last seen 05/30/13. If approved rx will print, have nurse call pt to pickup

## 2013-08-21 NOTE — Telephone Encounter (Signed)
rx ready for pickup 

## 2013-08-22 NOTE — Telephone Encounter (Signed)
Rx up front ready for pick up. Left message on patient voicemail

## 2013-09-15 ENCOUNTER — Telehealth: Payer: Self-pay | Admitting: Nurse Practitioner

## 2013-09-15 NOTE — Telephone Encounter (Signed)
Not due until 09/21/13

## 2013-09-17 MED ORDER — OXYCODONE HCL 15 MG PO TABS: 15.0000 mg | ORAL_TABLET | ORAL | Status: DC | PRN

## 2013-09-17 NOTE — Telephone Encounter (Signed)
rx ready for pickup 

## 2013-09-17 NOTE — Telephone Encounter (Signed)
Pt is going to [place].      He wants you to post date the rx because he won't be here to pick it up when time comes for refill.

## 2013-09-17 NOTE — Telephone Encounter (Signed)
Aware. 

## 2013-09-29 ENCOUNTER — Telehealth: Payer: Self-pay | Admitting: Nurse Practitioner

## 2013-09-29 NOTE — Telephone Encounter (Signed)
[facility] was willing to write RX for #60 Pt declined RX Only wants RX for #150

## 2013-09-29 NOTE — Telephone Encounter (Signed)
Please redo this prescription and my name once

## 2013-09-29 NOTE — Telephone Encounter (Signed)
Dr. Christell Constant I approved this pain rx but he is out of the state and must be signed by physician- can you red under your name and sign for him please.

## 2013-09-30 NOTE — Telephone Encounter (Signed)
Find out what patient wants to do?  

## 2013-10-13 ENCOUNTER — Encounter (INDEPENDENT_AMBULATORY_CARE_PROVIDER_SITE_OTHER): Payer: Self-pay

## 2013-10-13 ENCOUNTER — Ambulatory Visit (INDEPENDENT_AMBULATORY_CARE_PROVIDER_SITE_OTHER): Payer: Medicare PPO | Admitting: Nurse Practitioner

## 2013-10-13 ENCOUNTER — Encounter: Payer: Self-pay | Admitting: Nurse Practitioner

## 2013-10-13 VITALS — BP 132/78 | HR 111 | Temp 98.4°F | Ht 72.0 in | Wt 247.0 lb

## 2013-10-13 DIAGNOSIS — E119 Type 2 diabetes mellitus without complications: Secondary | ICD-10-CM | POA: Insufficient documentation

## 2013-10-13 DIAGNOSIS — M549 Dorsalgia, unspecified: Secondary | ICD-10-CM

## 2013-10-13 DIAGNOSIS — F329 Major depressive disorder, single episode, unspecified: Secondary | ICD-10-CM

## 2013-10-13 DIAGNOSIS — E7800 Pure hypercholesterolemia, unspecified: Secondary | ICD-10-CM

## 2013-10-13 DIAGNOSIS — R079 Chest pain, unspecified: Secondary | ICD-10-CM

## 2013-10-13 LAB — POCT GLYCOSYLATED HEMOGLOBIN (HGB A1C): Hemoglobin A1C: 5.6

## 2013-10-13 MED ORDER — OXYCODONE HCL 15 MG PO TABS: 15.0000 mg | ORAL_TABLET | ORAL | Status: DC | PRN

## 2013-10-13 MED ORDER — LAMOTRIGINE 200 MG PO TABS: 200.0000 mg | ORAL_TABLET | Freq: Two times a day (BID) | ORAL | Status: AC

## 2013-10-13 NOTE — Patient Instructions (Signed)

## 2013-10-13 NOTE — Progress Notes (Signed)
Subjective:    Patient ID: Shane Barnes, male    DOB: 02-Mar-1961, 52 y.o.   MRN: [id number]  HPI Patient here today for follow up- he also goes to the [place] for some of his care- he is doing well today without complaints. Patient suppose to be on metformin but he said it made him nauseaous. He was also suppose to be on crestor for chlesterol and he said he thought that is what caused his hgbA1c going up. But actually his liver enzymes were elevated and we were holding crestor. Patient Active Problem List   Diagnosis Date Noted  . Type II or unspecified type diabetes mellitus without mention of complication, not stated as uncontrolled 10/13/2013  . Chest pain 11/20/2012  . Tobacco abuse 11/20/2012  . Back pain   . Depression   . Hypercholesteremia    Outpatient Encounter Prescriptions as of 10/13/2013  Medication Sig  . folic acid (FOLVITE) 1 MG tablet Take 1 mg by mouth 2 (two) times daily.  Marland Kitchen glucose blood test strip Patient test 1 X per day and prn-- Dx 250.00  . lamoTRIgine (LAMICTAL) 200 MG tablet Take 200 mg by mouth 2 (two) times daily.  . methylphenidate (RITALIN) 10 MG tablet Take 20 mg by mouth 2 (two) times daily.   Marland Kitchen oxyCODONE (ROXICODONE) 15 MG immediate release tablet Take 1 tablet (15 mg total) by mouth every 4 (four) hours as needed.  . [DISCONTINUED] metFORMIN (GLUCOPHAGE) 500 MG tablet Take 1 tablet (500 mg total) by mouth 2 (two) times daily with a meal.  . [DISCONTINUED] venlafaxine XR (EFFEXOR-XR) 150 MG 24 hr capsule Take 150 mg by mouth daily.       Review of Systems  Constitutional: Negative.   HENT: Negative.   Respiratory: Negative for cough, chest tightness and shortness of breath.   Cardiovascular: Negative for chest pain, palpitations and leg swelling.  Gastrointestinal: Negative.   Endocrine: Negative.   Genitourinary: Negative.   Musculoskeletal: Negative.   Neurological: Negative.   Hematological: Negative.   Psychiatric/Behavioral: Negative.     All other systems reviewed and are negative.       Objective:   Physical Exam  Constitutional: He is oriented to person, place, and time. He appears well-developed and well-nourished.  HENT:  Head: Normocephalic.  Right Ear: External ear normal.  Left Ear: External ear normal.  Nose: Nose normal.  Mouth/Throat: Oropharynx is clear and moist.  Eyes: EOM are normal. Pupils are equal, round, and reactive to light.  Neck: Normal range of motion. Neck supple. No JVD present. No thyromegaly present.  Cardiovascular: Normal rate, regular rhythm, normal heart sounds and intact distal pulses.  Exam reveals no gallop and no friction rub.   No murmur heard. Pulmonary/Chest: Effort normal and breath sounds normal. No respiratory distress. He has no wheezes. He has no rales. He exhibits no tenderness.  Abdominal: Soft. Bowel sounds are normal. He exhibits no mass. There is no tenderness.  Genitourinary: Prostate normal and penis normal.  Musculoskeletal: Normal range of motion. He exhibits no edema.  Lymphadenopathy:    He has no cervical adenopathy.  Neurological: He is alert and oriented to person, place, and time. No cranial nerve deficit.  Skin: Skin is warm and dry.  Psychiatric: He has a normal mood and affect. His behavior is normal. Judgment and thought content normal.    BP 153/91  Pulse 111  Temp(Src) 98.4 F (36.9 C) (Oral)  Ht 6' (1.829 m)  Wt 247 lb (112.038  kg)  BMI 33.49 kg/m2  Results for orders placed in visit on 10/13/13  POCT GLYCOSYLATED HEMOGLOBIN (HGB A1C)      Result Value Range   Hemoglobin A1C 5.6%          Assessment & Plan:   1. Hypercholesteremia   2. Chest pain   3. Diabetes   4. Type II or unspecified type diabetes mellitus without mention of complication, not stated as uncontrolled   5. Depression   6. Back pain    Orders Placed This Encounter  Procedures  . CMP14+EGFR  . NMR, lipoprofile  . POCT glycosylated hemoglobin (Hb A1C)   Meds  ordered this encounter  Medications  . lamoTRIgine (LAMICTAL) 200 MG tablet    Sig: Take 1 tablet (200 mg total) by mouth 2 (two) times daily.    Dispense:  60 tablet    Refill:  5    Order Specific Question:  Supervising Provider    Answer:  Ernestina Penna [[id number]]  . oxyCODONE (ROXICODONE) 15 MG immediate release tablet    Sig: Take 1 tablet (15 mg total) by mouth every 4 (four) hours as needed.    Dispense:  150 tablet    Refill:  0    Order Specific Question:  Supervising Provider    Answer:  Deborra Medina   Continue to hold metformin Continue low carb diet Discussed pain meds and need to decrease amount taken Continue all meds Labs pending Diet and exercise encouraged Health maintenance reviewed Follow up in 3 months  Mary-Margaret Daphine Deutscher, FNP

## 2013-10-14 LAB — NMR, LIPOPROFILE
HDL Particle Number: 27.4 umol/L — ABNORMAL LOW (ref >=30.5)
LDL Particle Number: 2990 nmol/L — ABNORMAL HIGH (ref <1000)
LDL Size: 20.6 nm (ref >20.5)
LDLC SERPL CALC-MCNC: 175 mg/dL — ABNORMAL HIGH (ref <100)
Small LDL Particle Number: 1666 nmol/L — ABNORMAL HIGH (ref <=527)
Triglycerides by NMR: 219 mg/dL — ABNORMAL HIGH (ref <150)

## 2013-10-14 LAB — CMP14+EGFR
ALT: 36 IU/L (ref 0–44)
Albumin/Globulin Ratio: 2.3 (ref 1.1–2.5)
CO2: 27 mmol/L (ref 18–29)
Calcium: 9.9 mg/dL (ref 8.7–10.2)
Creatinine, Ser: 1.15 mg/dL (ref 0.76–1.27)
Globulin, Total: 2.1 g/dL (ref 1.5–4.5)
Glucose: 82 mg/dL (ref 65–99)
Potassium: 4.9 mmol/L (ref 3.5–5.2)
Sodium: 143 mmol/L (ref 134–144)

## 2013-10-15 ENCOUNTER — Telehealth: Payer: Self-pay | Admitting: *Deleted

## 2013-10-15 ENCOUNTER — Other Ambulatory Visit: Payer: Self-pay | Admitting: Nurse Practitioner

## 2013-10-15 MED ORDER — ATORVASTATIN CALCIUM 40 MG PO TABS: 40.0000 mg | ORAL_TABLET | Freq: Every day | ORAL | Status: AC

## 2013-10-15 MED ORDER — ROSUVASTATIN CALCIUM 20 MG PO TABS: 20.0000 mg | ORAL_TABLET | Freq: Every day | ORAL | Status: DC

## 2013-10-15 NOTE — Telephone Encounter (Signed)
lipitor rx sent to pharmacy 

## 2013-10-15 NOTE — Telephone Encounter (Signed)
Message copied by Baltazar Apo on Wed Oct 15, 2013  4:16 PM ------      Message from: Bennie Pierini      Created: Wed Oct 15, 2013  1:44 PM       hgba1c discussed at appointment      CMP- normal      LDL particle # and LDl way to high- rx for cretor sent to pharmacy      Strict low fat diet an exercise- recheck in 3 months ------

## 2013-10-15 NOTE — Telephone Encounter (Signed)
Pt refused crestor. [place] Spaniel he has tried this in past and increased his blood sugar. Pt said he would try different medication if you would call in different rx.

## 2013-10-16 NOTE — Telephone Encounter (Signed)
Patient aware.

## 2013-10-17 ENCOUNTER — Telehealth: Payer: Self-pay | Admitting: Nurse Practitioner

## 2013-10-17 NOTE — Telephone Encounter (Signed)
Yes pharmacy informed

## 2013-11-12 ENCOUNTER — Telehealth: Payer: Self-pay | Admitting: Nurse Practitioner

## 2013-11-14 ENCOUNTER — Other Ambulatory Visit: Payer: Self-pay | Admitting: *Deleted

## 2013-11-14 MED ORDER — OXYCODONE HCL 15 MG PO TABS: 15.0000 mg | ORAL_TABLET | ORAL | Status: DC | PRN

## 2013-11-14 NOTE — Telephone Encounter (Signed)
Script approved and signed by Ander SladeBill Oxford, FNP.

## 2013-11-18 NOTE — Telephone Encounter (Signed)
Medication was refilled on 12/15.

## 2013-11-18 NOTE — Telephone Encounter (Signed)
He needs follow up for pain medicinep

## 2013-11-25 ENCOUNTER — Telehealth: Payer: Self-pay | Admitting: *Deleted

## 2013-11-25 NOTE — Telephone Encounter (Signed)
Patient came in requesting refill script for his pain medication.  His father will be having surgery and Mr. Shane Barnes will be in another state for this.  He thinks you can pre date a script and give it to him.  He also needs a refill on cholesterol medication. He wants a call back today from the nurse.

## 2013-11-25 NOTE — Telephone Encounter (Signed)
Will refill cholesterol meds- what state are you going to because some states will not allow NP to sign narcotics.

## 2013-11-25 NOTE — Telephone Encounter (Signed)
Please call back with the name of state you will be trying to fill script at.  Nurse practitioner's may not be able to fill scripts in some states.

## 2013-11-26 ENCOUNTER — Other Ambulatory Visit: Payer: Self-pay | Admitting: Nurse Practitioner

## 2013-11-26 MED ORDER — OXYCODONE HCL 15 MG PO TABS: 15.0000 mg | ORAL_TABLET | ORAL | Status: DC | PRN

## 2013-11-26 MED ORDER — ROSUVASTATIN CALCIUM 20 MG PO TABS: 20.0000 mg | ORAL_TABLET | Freq: Every day | ORAL | Status: AC

## 2013-11-27 NOTE — Telephone Encounter (Signed)
This already done

## 2013-12-03 ENCOUNTER — Telehealth: Payer: Self-pay | Admitting: Nurse Practitioner

## 2013-12-04 NOTE — Telephone Encounter (Signed)
This was faxed

## 2013-12-17 ENCOUNTER — Telehealth: Payer: Self-pay | Admitting: Nurse Practitioner

## 2013-12-17 NOTE — Telephone Encounter (Signed)
addressed

## 2013-12-17 NOTE — Telephone Encounter (Signed)
Spoke with pharmacy

## 2013-12-23 ENCOUNTER — Telehealth: Payer: Self-pay | Admitting: Nurse Practitioner

## 2014-01-05 NOTE — Telephone Encounter (Signed)
Pt does not want, called company

## 2014-01-12 ENCOUNTER — Other Ambulatory Visit: Payer: Self-pay | Admitting: Nurse Practitioner

## 2014-01-13 NOTE — Telephone Encounter (Signed)
Last seen 10/13/14, last filled 12/13/13. Rx will print

## 2014-01-16 ENCOUNTER — Other Ambulatory Visit (INDEPENDENT_AMBULATORY_CARE_PROVIDER_SITE_OTHER): Payer: Medicare PPO

## 2014-01-16 ENCOUNTER — Other Ambulatory Visit: Payer: Self-pay | Admitting: Nurse Practitioner

## 2014-01-16 DIAGNOSIS — I10 Essential (primary) hypertension: Secondary | ICD-10-CM

## 2014-01-16 DIAGNOSIS — E119 Type 2 diabetes mellitus without complications: Secondary | ICD-10-CM

## 2014-01-16 DIAGNOSIS — Z79899 Other long term (current) drug therapy: Secondary | ICD-10-CM

## 2014-01-16 DIAGNOSIS — E785 Hyperlipidemia, unspecified: Secondary | ICD-10-CM

## 2014-01-16 LAB — POCT GLYCOSYLATED HEMOGLOBIN (HGB A1C): HEMOGLOBIN A1C: 7.2

## 2014-01-16 MED ORDER — OXYCODONE HCL 15 MG PO TABS: 15.0000 mg | ORAL_TABLET | ORAL | Status: DC | PRN

## 2014-01-16 NOTE — Progress Notes (Signed)
Patient wanted to discuss pain prescription. He had a urine drug screen while in office today before prescription was given over to him. He questioned why he had so much trouble with getting oxycodone. He had to drive from [place] to pick it up today and drive back to [place] when he left the office. I explained that this is a highly controlled substance and we aren't allowed by law to call in this medication or to provide refills on the written prescription. Prescriptions will not be written with a future date either. Also some states do not allow nurse practitioners to write controlled substances. I strongly suggested that if he is going to be in [place] for a while that he needs to find a local provider there to write this prescription. He can obtain his medical records for this purpose. He stated that he has requested them.  While in the office he also questioned whether we had Cialis samples. I explained that we do receive samples but it is up to the drug rep how many samples they bring and how often they bring them. We do not "order" samples. He does not have a prescription for Cialis so I explained that I could not give him any without a current prescription because I can't prescribe medication. He would need to go through his provider for that. He rolled his eyes and shook his head. I once again reiterated that I am not allowed to prescribe medications. He requested to speak to our practice administer but they were out of the office at the time. I suggested that he call and speak with him later today.

## 2014-01-16 NOTE — Progress Notes (Signed)
Pt came in for labs only 

## 2014-01-20 LAB — PRESCRIPTION ABUSE MONITORING 17P, URINE
Amphetamine Screen, Urine: NEGATIVE ng/mL
BARBITURATES: NEGATIVE ng/mL
BUPRENORPHINE, URINE: NEGATIVE ng/mL
Benzodiazepines: NEGATIVE ng/mL
CANNABINOID: NEGATIVE ng/mL
COCAINE (METAB.), URINE: NEGATIVE ng/mL
Carisoprodol/Meprobamate, Ur: NEGATIVE ng/mL
Creatinine, Urine: 74.7 mg/dL (ref 20.0–300.0)
FENTANYL, URINE: NEGATIVE pg/mL
Meperidine: NEGATIVE ng/mL
Methadone: NEGATIVE ng/mL
Opiate: POSITIVE ng/mL
Oxycodone+Oxymorphone Ur Ql Scn: POSITIVE ng/mL
Propoxyphene: NEGATIVE ng/mL
Tapentadol, Urine: NEGATIVE ng/mL
Tramadol: NEGATIVE ng/mL
ZOLPIDEM (AMBIEN), URINE: NEGATIVE ng/mL

## 2014-01-20 LAB — PLEASE NOTE:

## 2014-01-20 LAB — CMP14+EGFR
ALT: 88 IU/L — AB (ref 0–44)
AST: 56 IU/L — AB (ref 0–40)
Albumin/Globulin Ratio: 2.6 — ABNORMAL HIGH (ref 1.1–2.5)
Albumin: 5.1 g/dL (ref 3.5–5.5)
Alkaline Phosphatase: 104 IU/L (ref 39–117)
BUN/Creatinine Ratio: 12 (ref 9–20)
BUN: 10 mg/dL (ref 6–24)
CO2: 26 mmol/L (ref 18–29)
Calcium: 9.8 mg/dL (ref 8.7–10.2)
Chloride: 95 mmol/L — ABNORMAL LOW (ref 97–108)
Creatinine, Ser: 0.84 mg/dL (ref 0.76–1.27)
GFR calc Af Amer: 116 mL/min/{1.73_m2} (ref >59)
GFR calc non Af Amer: 101 mL/min/{1.73_m2} (ref >59)
Globulin, Total: 2 g/dL (ref 1.5–4.5)
Glucose: 324 mg/dL — ABNORMAL HIGH (ref 65–99)
POTASSIUM: 4.3 mmol/L (ref 3.5–5.2)
SODIUM: 137 mmol/L (ref 134–144)
Total Bilirubin: 0.4 mg/dL (ref 0.0–1.2)
Total Protein: 7.1 g/dL (ref 6.0–8.5)

## 2014-01-20 LAB — NMR, LIPOPROFILE
Cholesterol: 187 mg/dL (ref <200)
HDL CHOLESTEROL BY NMR: 37 mg/dL — AB (ref >=40)
HDL Particle Number: 32.7 umol/L (ref >=30.5)
LDL Particle Number: 2643 nmol/L — ABNORMAL HIGH (ref <1000)
LDL Size: 20 nm — ABNORMAL LOW (ref >20.5)
LP-IR SCORE: 87 — AB (ref <=45)
Small LDL Particle Number: 2005 nmol/L — ABNORMAL HIGH (ref <=527)
Triglycerides by NMR: 494 mg/dL — ABNORMAL HIGH (ref <150)

## 2014-02-11 ENCOUNTER — Telehealth: Payer: Self-pay | Admitting: Nurse Practitioner

## 2014-02-12 NOTE — Telephone Encounter (Signed)
Will have to wait for me to get back- no one else willl fill for him.

## 2014-02-16 ENCOUNTER — Other Ambulatory Visit: Payer: Medicare PPO

## 2014-02-16 ENCOUNTER — Other Ambulatory Visit: Payer: Self-pay | Admitting: Nurse Practitioner

## 2014-02-16 DIAGNOSIS — G894 Chronic pain syndrome: Secondary | ICD-10-CM

## 2014-02-16 MED ORDER — OXYCODONE HCL 15 MG PO TABS: 15.0000 mg | ORAL_TABLET | ORAL | Status: AC | PRN

## 2014-02-16 NOTE — Telephone Encounter (Signed)
Patient came in left urine and picked up RX

## 2014-02-18 LAB — 15+OXYCODONE+CRT-SCR, U
Amphetamine Screen, Urine: NEGATIVE ng/mL
BARBITURATES: NEGATIVE ng/mL
BUPRENORPHINE, URINE: NEGATIVE ng/mL
Benzodiazepines: NEGATIVE ng/mL
Cannabinoid: NEGATIVE ng/mL
Carisoprodol/Meprobamate, Ur: NEGATIVE ng/mL
Cocaine (Metab.), Urine: NEGATIVE ng/mL
Creatinine, Urine: 218.9 mg/dL (ref 20.0–300.0)
FENTANYL, URINE: NEGATIVE pg/mL
METHADONE: NEGATIVE ng/mL
Meperidine: NEGATIVE ng/mL
Opiate: NEGATIVE ng/mL
Oxycodone+Oxymorphone Ur Ql Scn: POSITIVE ng/mL
PROPOXYPHENE: NEGATIVE ng/mL
TAPENTADOL, URINE: NEGATIVE ng/mL
Tramadol: NEGATIVE ng/mL
Zolpidem (Ambien), Urine: NEGATIVE ng/mL

## 2014-02-18 LAB — PLEASE NOTE:
